# Patient Record
Sex: Female | Born: 1955 | Race: White | Hispanic: No | Marital: Married | Smoking: Former smoker
Health system: Southern US, Community
[De-identification: ages and names within clinical notes are randomized; demographics above are authoritative.]

## PROBLEM LIST (undated history)

## (undated) DIAGNOSIS — L409 Psoriasis, unspecified: Secondary | ICD-10-CM

## (undated) DIAGNOSIS — E119 Type 2 diabetes mellitus without complications: Secondary | ICD-10-CM

## (undated) HISTORY — PX: APPENDECTOMY: SHX54

---

## 1997-09-19 ENCOUNTER — Other Ambulatory Visit: Admission: RE | Admit: 1997-09-19 | Discharge: 1997-09-19 | Payer: Self-pay | Admitting: Obstetrics & Gynecology

## 2000-07-03 ENCOUNTER — Encounter: Payer: Self-pay | Admitting: Dermatology

## 2000-07-03 ENCOUNTER — Ambulatory Visit (HOSPITAL_COMMUNITY): Admission: RE | Admit: 2000-07-03 | Discharge: 2000-07-03 | Payer: Self-pay | Admitting: Dermatology

## 2000-07-03 ENCOUNTER — Encounter (INDEPENDENT_AMBULATORY_CARE_PROVIDER_SITE_OTHER): Payer: Self-pay | Admitting: Specialist

## 2000-09-03 ENCOUNTER — Other Ambulatory Visit: Admission: RE | Admit: 2000-09-03 | Discharge: 2000-09-03 | Payer: Self-pay | Admitting: Obstetrics & Gynecology

## 2000-09-03 ENCOUNTER — Encounter (INDEPENDENT_AMBULATORY_CARE_PROVIDER_SITE_OTHER): Payer: Self-pay

## 2001-09-16 ENCOUNTER — Other Ambulatory Visit: Admission: RE | Admit: 2001-09-16 | Discharge: 2001-09-16 | Payer: Self-pay | Admitting: Obstetrics & Gynecology

## 2002-11-02 ENCOUNTER — Other Ambulatory Visit: Admission: RE | Admit: 2002-11-02 | Discharge: 2002-11-02 | Payer: Self-pay | Admitting: Obstetrics & Gynecology

## 2003-09-28 ENCOUNTER — Encounter: Admission: RE | Admit: 2003-09-28 | Discharge: 2003-09-28 | Payer: Self-pay | Admitting: Family Medicine

## 2003-11-02 ENCOUNTER — Other Ambulatory Visit: Admission: RE | Admit: 2003-11-02 | Discharge: 2003-11-02 | Payer: Self-pay | Admitting: Obstetrics & Gynecology

## 2004-12-05 ENCOUNTER — Other Ambulatory Visit: Admission: RE | Admit: 2004-12-05 | Discharge: 2004-12-05 | Payer: Self-pay | Admitting: Obstetrics & Gynecology

## 2005-04-12 ENCOUNTER — Encounter (INDEPENDENT_AMBULATORY_CARE_PROVIDER_SITE_OTHER): Payer: Self-pay | Admitting: Specialist

## 2005-04-12 ENCOUNTER — Ambulatory Visit (HOSPITAL_COMMUNITY): Admission: RE | Admit: 2005-04-12 | Discharge: 2005-04-12 | Payer: Self-pay | Admitting: Dermatology

## 2006-10-24 ENCOUNTER — Encounter: Admission: RE | Admit: 2006-10-24 | Discharge: 2006-10-24 | Payer: Self-pay | Admitting: Family Medicine

## 2006-10-24 ENCOUNTER — Inpatient Hospital Stay (HOSPITAL_COMMUNITY): Admission: EM | Admit: 2006-10-24 | Discharge: 2006-10-27 | Payer: Self-pay | Admitting: Emergency Medicine

## 2006-10-25 ENCOUNTER — Encounter (INDEPENDENT_AMBULATORY_CARE_PROVIDER_SITE_OTHER): Payer: Self-pay | Admitting: Surgery

## 2006-11-15 ENCOUNTER — Encounter: Admission: RE | Admit: 2006-11-15 | Discharge: 2006-11-15 | Payer: Self-pay | Admitting: Family Medicine

## 2009-02-28 ENCOUNTER — Ambulatory Visit (HOSPITAL_COMMUNITY): Admission: RE | Admit: 2009-02-28 | Discharge: 2009-02-28 | Payer: Self-pay | Admitting: Dermatology

## 2010-04-05 IMAGING — US US BIOPSY
1 series · 14 of 15 positions shown · non-contrast
Comparison: none

CLINICAL DATA: Long-term methotrexate use

[Series 1: us biopsy · 0.26mm/px · 14 of 15 slices shown]
[im 1/15]
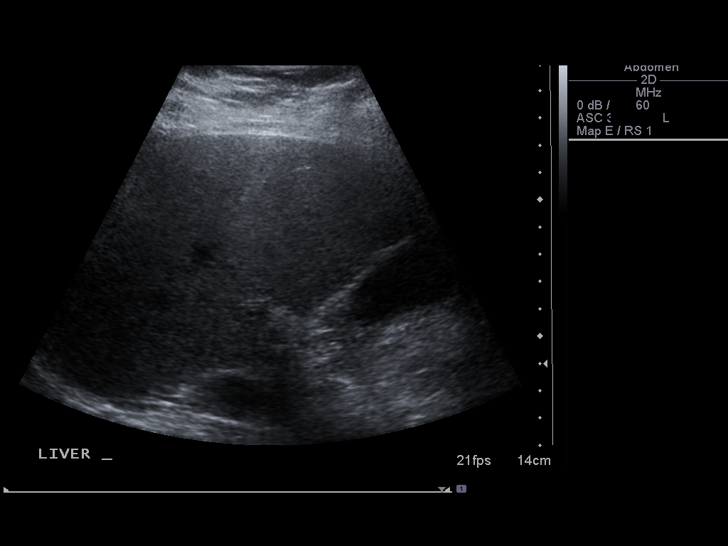
[im 2/15]
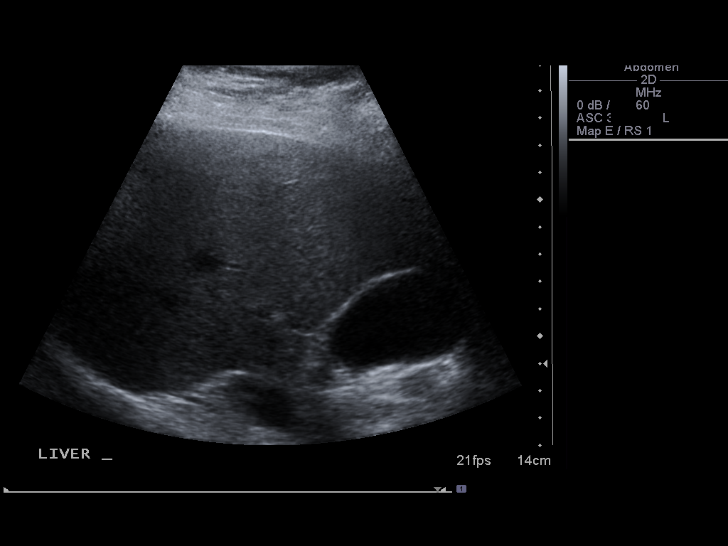
[im 3/15]
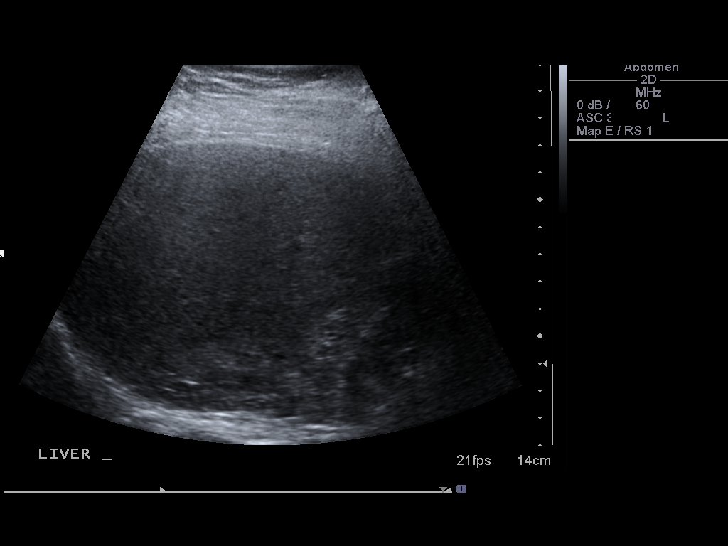
[im 4/15]
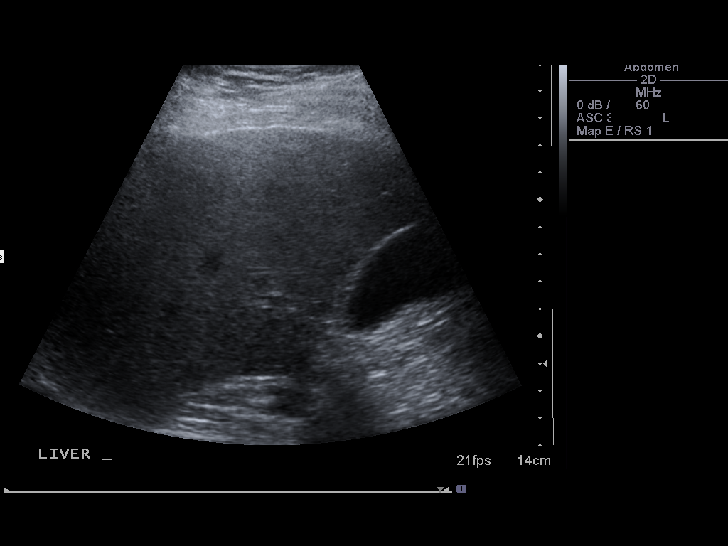
[im 5/15]
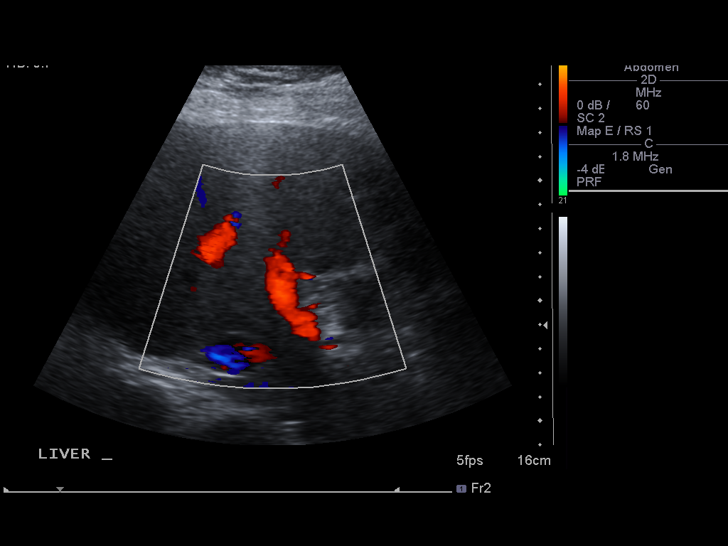
[im 6/15]
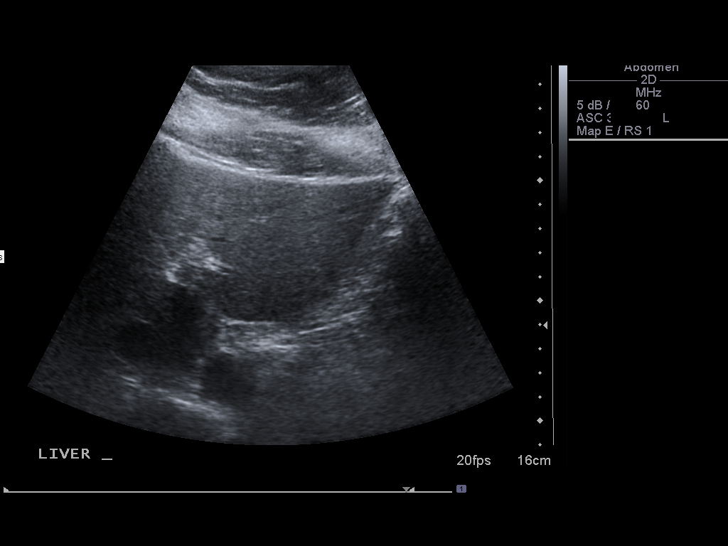
[im 7/15]
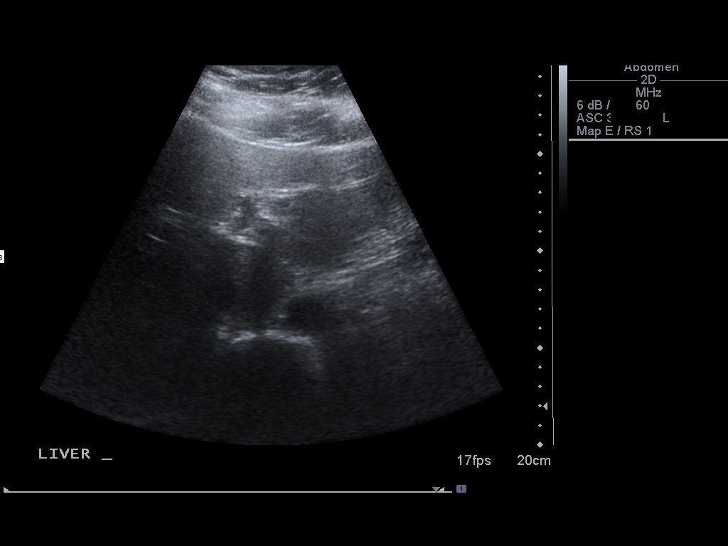
[im 9/15]
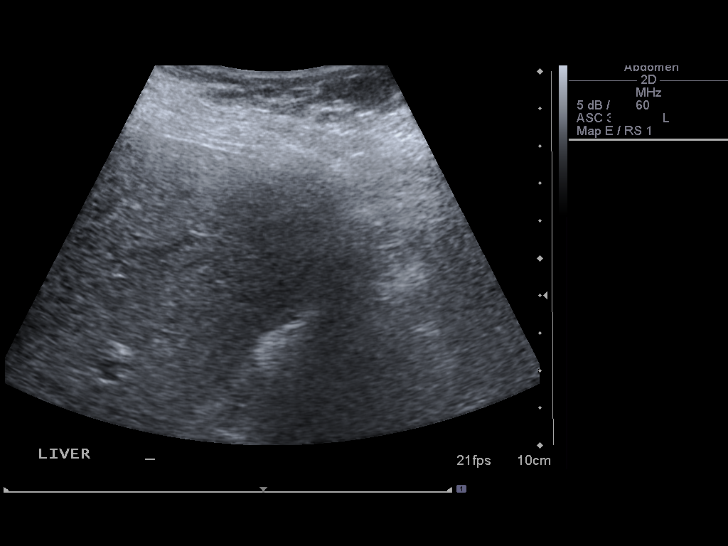
[im 10/15]
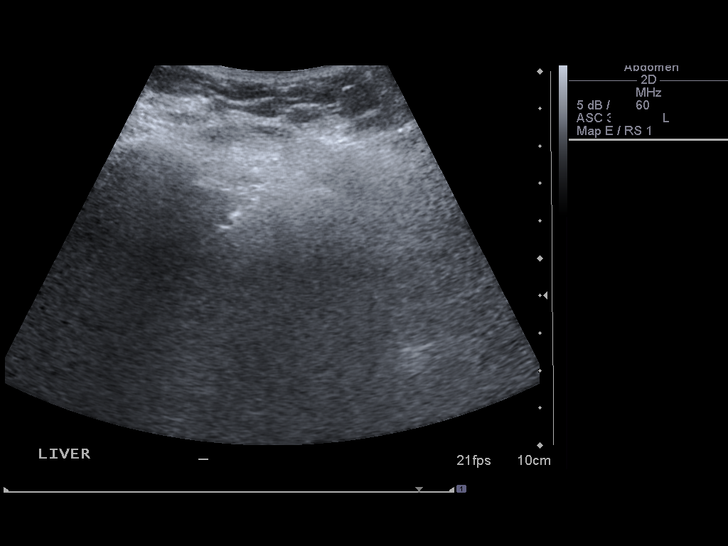
[im 11/15]
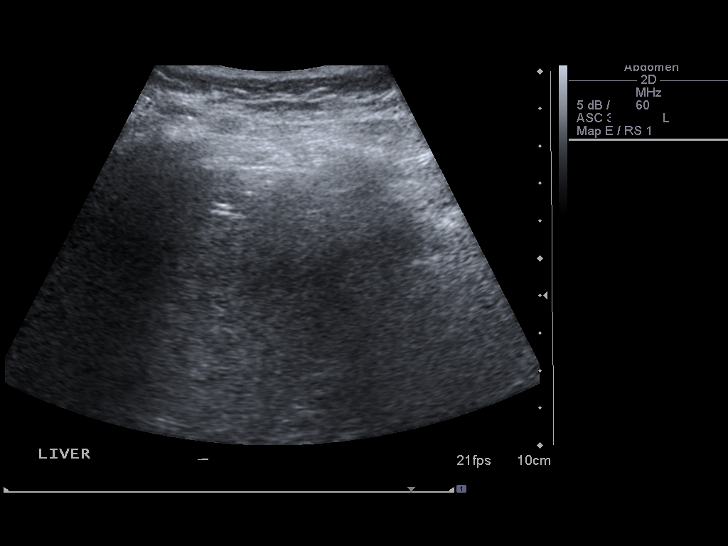
[im 12/15]
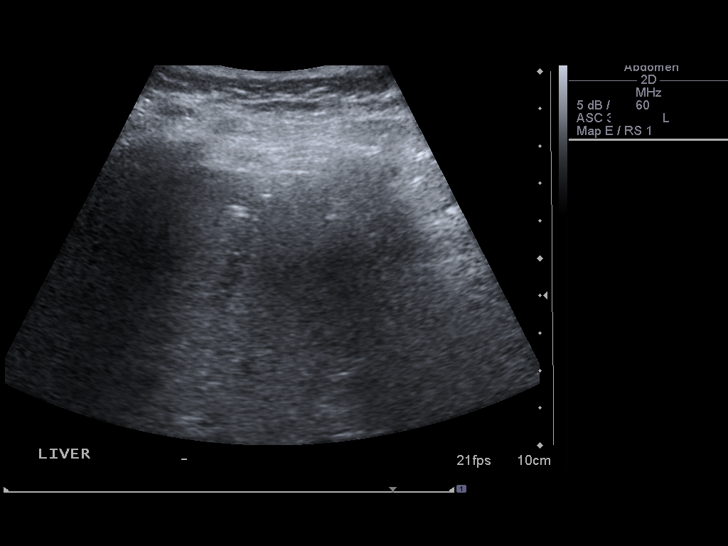
[im 13/15]
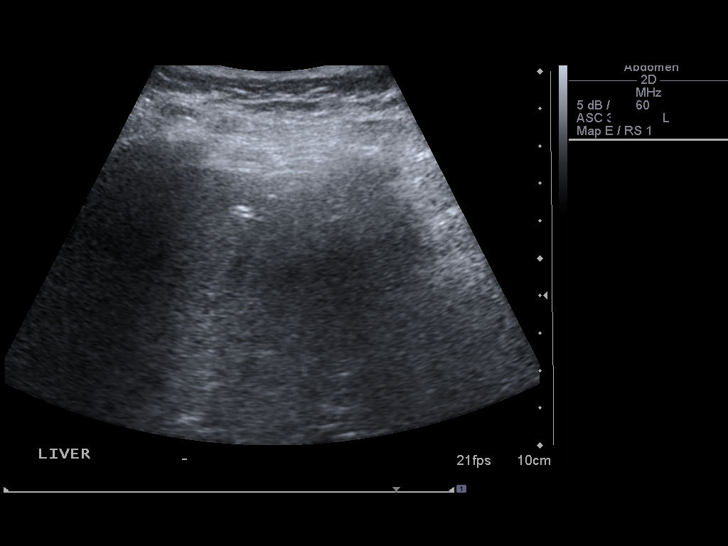
[im 14/15]
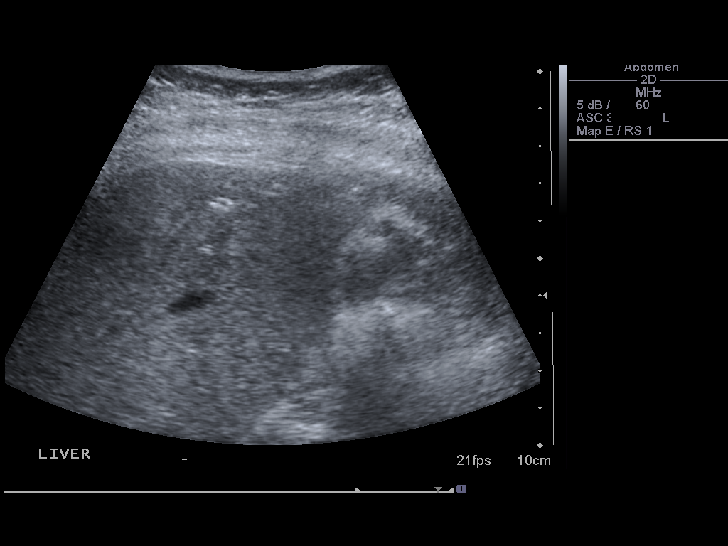
[im 15/15]
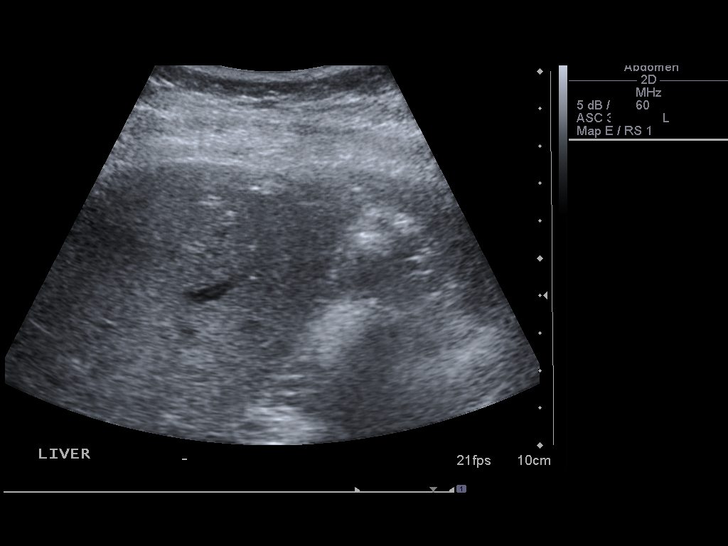

[14 of 15 positions shown; findings below may reference images not displayed]

ULTRASOUND-GUIDED CORE LIVER BIOPSY

Technique and findings: An ultrasound guided liver biopsy was
thoroughly discussed with the patient and questions were answered.
The benefits, risks, alternatives, and complications were also
discussed. The patient understands and wishes to proceed with the
procedure. A verbal as well as written consent was obtained.

Survey ultrasound of the liver was performed and an appropriate
skin entry site was determined. Skin site was marked, prepped with
Betadine, and draped in usual sterile fashion, and infiltrated
locally with 1% lidocaine. Intravenous fentanyl and Versed were
administered as conscious sedation during continuous
cardiorespiratory monitoring by the radiology RN, with a total
moderate sedation time of 7 minutes. A 17 gauge trocar needle was
advanced under ultrasound guidance into the liver.  3 coaxial 18
gauge core samples were then obtained through the guide needle. The
guide needle was removed. Postprocedure scans demonstrate no
apparent complication.

IMPRESSION

1. Technically successful ultrasound guided core liver biopsy.

## 2010-05-06 LAB — CBC
HCT: 37.6 % (ref 36.0–46.0)
Hemoglobin: 12.4 g/dL (ref 12.0–15.0)
MCHC: 32.9 g/dL (ref 30.0–36.0)
RBC: 4.07 MIL/uL (ref 3.87–5.11)
RDW: 15.7 % — ABNORMAL HIGH (ref 11.5–15.5)

## 2010-07-03 NOTE — H&P (Signed)
NAMECALVARY, DIFRANCO                 ACCOUNT NO.:  [id number]   MEDICAL RECORD NO.:  [id number]          PATIENT TYPE:  EMS   LOCATION:  MAJO                         FACILITY:  [facility]   PHYSICIAN:  Velora Heckler, MD      DATE OF BIRTH:  [birth year]   DATE OF ADMISSION:  10/24/2006  DATE OF DISCHARGE:                              HISTORY & PHYSICAL   REFERRING PHYSICIAN:  Joen Laura, MD, emergency department.   PRIMARY PHYSICIAN:  Blair Heys, MD, [facility] at [place].   CHIEF COMPLAINT:  Abdominal pain.   HISTORY OF PRESENT ILLNESS:  Kimmberly Hochstetler is a 55 year old white female  from [place], [place].  Presents with 24-hour history of right  lower quadrant abdominal pain.  The patient noted onset of pain.  She  denies nausea or vomiting.  She denies fevers or chills.  She denies  diarrhea or constipation.  She was seen at the office of her primary  care physician and referred to [facility] imaging for CT scan of the  abdomen and pelvis which was performed this afternoon.  That  demonstrated findings consistent with retrocecal acute appendicitis.  Other significant findings included a left renal mass and a left adrenal  adenoma.  These will require further workup at a later time.   PAST MEDICAL HISTORY:  History of psoriasis, history of heart murmur  secondary to rheumatic fever as a child, status post excision of  ganglion cyst from wrist.   MEDICATIONS:  Methotrexate, Prempro, multivitamin.   ALLERGIES:  None known.   SOCIAL HISTORY:  The patient is married.  She has 2 children.  She is  accompanied by family members.  She works in Location manager at [employer].  She quit smoking 3 years ago.  She drinks alcohol on occasion.   FAMILY HISTORY:  Noncontributory.   REVIEW OF SYSTEMS:  Fifteen-system review without significant other  findings except as noted above.   EXAM:  GENERAL:  55 year old mildly obese white female, no acute  distress, on a stretcher in the emergency  department.  Temp 98.3, pulse 83, respirations 20, blood pressure 126/86.  HEENT:  Shows her to be normocephalic, atraumatic.  Sclerae clear.  Conjunctiva clear.  Pupils equal and reactive.  Dentition good.  Mucous  membranes moist.  Voice normal.  Palpation of the neck shows no thyroid  nodularity.  No lymphadenopathy.  No tenderness.  No mass.  Airway is  midline.  LUNGS: Clear to auscultation bilaterally without rales, rhonchi or  wheeze.  CARDIAC EXAM:  Regular rate and rhythm without murmur.  Peripheral  pulses are full.  EXTREMITIES:  Nontender without edema.  ABDOMEN:  Soft.  Bowel sounds are present.  No surgical wounds.  Palpation, however, reveals tenderness in the right lower quadrant,  extending into the right flank.  There is mild guarding.  There is no  rebound.  There is no palpable mass.  There is no sign of hernia.  There  is no hepatosplenomegaly.  NEUROLOGICALLY:  The patient is alert and oriented without focal  deficit.  There is no sign of tremor.  LABORATORY STUDIES:  White count 10.1, hemoglobin 12.5, platelet count  334,000.  Electrolytes are normal.   RADIOGRAPHIC STUDIES:  CT scan abdomen and pelvis reviewed with Dr.  Maryelizabeth Rowan shows findings consistent with retrocecal acute  appendicitis.   IMPRESSION:  Acute appendicitis, probably retrocecal.   PLAN:  The patient will be admitted on the general surgery service at  [facility].  She will require Unasyn and gentamicin for  valvular prophylaxis and preoperative coverage.  She will be prepared  and taken to the operating room for appendectomy.  The patient and her  family, and I discussed laparoscopic approach versus the potential need  for open surgery.  They understand.  The patient and her family, and I  also discussed the other findings on CT scan including the left adrenal  mass and left renal mass, both of which will require further workup by  Dr. Gaylan Gerold following her acute illness.   They understand and wish to  proceed.      Velora Heckler, MD  Electronically Signed     TMG/MEDQ  D:  10/24/2006  T:  10/25/2006  Job:  [id number]   cc:   Velora Heckler, MD  Suzzette Righter, MD

## 2010-07-03 NOTE — Op Note (Signed)
NAMETraniece, [name]                 ACCOUNT NO.:  [id number]   MEDICAL RECORD NO.:  [id number]          PATIENT TYPE:  OBV   LOCATION:  5727                         FACILITY:  [facility]   PHYSICIAN:  Velora Heckler, MD      DATE OF BIRTH:  10/23/1955   DATE OF PROCEDURE:  10/25/2006  DATE OF DISCHARGE:                               OPERATIVE REPORT   PREOPERATIVE DIAGNOSIS:  Acute appendicitis.   POSTOPERATIVE DIAGNOSIS:  Acute appendicitis.   PROCEDURE:  Laparoscopic appendectomy.   SURGEON:  Velora Heckler, M.D., FACS   ANESTHESIA:  General.   ESTIMATED BLOOD LOSS:  Minimal.   PREPARATION:  Betadine.   COMPLICATIONS:  None.   INDICATIONS:  The patient is a 55 year old white female who presents  with a 24 hour history of right lower quadrant abdominal pain.  The  patient has had no fevers or chills.  She denies nausea or vomiting.  She has had a normal bowel movement.  The patient was seen by her  primary care physician and underwent CT scan of the abdomen which showed  findings consistent with acute appendicitis.  The patient is now brought  to the operating room for appendectomy.   BODY OF REPORT:  The procedure was done in OR number 16 at [facility].  The patient was brought to the operating room and placed in a  supine position on the operating room table.  Following the  administration of general anesthesia, the patient is prepped and draped  in the usual strict aseptic fashion.  After ascertaining that an  adequate level of anesthesia had been obtained, a midline incision was  made just above the level of the umbilicus with a #15 blade.  Dissection  was carried down to the fascia.  The fascia was incised in the midline  and the peritoneal cavity is entered cautiously.  A 0 Vicryl pursestring  suture was placed in the fascia.  An Hassan cannula was introduced and  secured with a pursestring suture.  The abdomen was insufflated with  carbon dioxide.  The  laparoscope was introduced and the abdomen  explored.  Operative ports were placed in the right upper quadrant and  left lower quadrant.   The cecum was mobilized.  The terminal ileum was somewhat adherent to  the right pelvic brim.  Adhesions were taken down with the harmonic  scalpel, allowing for mobilization of the terminal ileum and base of the  cecum.  There is a large inflammatory mass involving the base of the  cecum and extending laterally up the right colic gutter.  This was  gently dissected out using the harmonic scalpel and gentle blunt  dissection.  The tip of the appendix was adherent to the abdominal  sidewall likely explaining the patient's right lower quadrant pain.  After a difficult dissection, the base of the appendix was identified in  the inflammatory mass.  It is transected with an Endo-GIA stapler.  The  appendiceal artery is secured with a Liga clip.  The appendiceal  mesentery is taken down with the harmonic  scalpel.  Dissection was  carried the length of the appendix, skeletonizing the proximal appendix.  The distal appendix was wrapped in an inflammatory mass which was  excised with the specimen.  Good hemostasis was noted.  Fluid was  irrigated.  No true abscess exist.   There is considerable drainage from the right colic gutter which appears  inflammatory.  Therefore, a 19-French Harrison Mons drain is brought in through  a stab wound in the right lower quadrant and placed across the area of  inflammation and base of the cecum.  The drain is secured to the skin  with a 3-0 nylon suture and placed to bulb suction.  The appendix was  placed into an EndoCatch bag and withdrawn through the left lower  quadrant port.  The right lower quadrant was irrigated with warm saline  which was evacuated.  Good hemostasis was noted.  No complications were  noted.  The appendix was withdrawn through the left lower quadrant port.  Pneumoperitoneum was released.  All ports were  removed.  Good hemostasis  was noted at all port sites.  The 0 Vicryl pursestring suture was tied  securely.  The wounds were irrigated with warm saline.  The wounds were  anesthetized with local Marcaine.  All skin incisions were closed with  interrupted 4-0 Vicryl subcuticular sutures.  The wounds were washed and  dried and Benzoin and Steri-Strips were applied.  Sterile dressings were  applied.  The patient is awakened from anesthesia and brought to the  recovery room in stable condition.  The patient tolerated the procedure  well.      Velora Heckler, MD  Electronically Signed     TMG/MEDQ  D:  10/25/2006  T:  10/25/2006  Job:  [id number]   cc:   Bryan Lemma. Manus Gunning, M.D.

## 2010-07-06 NOTE — Discharge Summary (Signed)
NAME:  [name], [name]                 ACCOUNT NO.:  [id number]   MEDICAL RECORD NO.:  [id number]          PATIENT TYPE:  INP   LOCATION:  5727                         FACILITY:  [facility]   PHYSICIAN:  Adolph Pollack, M.D.DATE OF BIRTH:  [birth year]   DATE OF ADMISSION:  10/24/2006  DATE OF DISCHARGE:  10/27/2006                               DISCHARGE SUMMARY   CHIEF COMPLAINT/REASON FOR ADMISSION:  Ms. Shoaf is a 50-year female  patient who presented to the ER for lower quadrant abdominal pain.  CT  demonstrated acute retrocecal appendicitis.  On abdominal exam, she was  tender in right lower quadrant extending into the right flank.  White  count was 10,100.  The patient was subsequently admitted with a  diagnosis of acute appendicitis.  In addition, the patient has a history  of a heart murmur secondary to rheumatic fever as a child, so she was  given Unasyn and gentamicin for valvular prophylaxis as well as  preoperative coverage before undergoing surgery.   HOSPITAL COURSE:  The patient was admitted and taken to the OR where she  underwent laparoscopic appendectomy without incident for acute  appendicitis which was nonperforated.  In the intraoperative period, a  JP drain was placed which drained thin serous and bloody returns  throughout the hospitalization.   By postoperative day 2, the patient was tolerating a solid diet and  Percocet.  Abdominal incisions were unremarkable.  Her JP drain still  had 110 cc of bloody serous fluid.  She was otherwise deemed appropriate  for discharge home.  Dr. Abbey Chatters recommended she be discharged with  the drain in place and follow up at the office next week to have the  drain removed.   FINAL DISCHARGE DIAGNOSES:  1. Acute appendicitis, nonperforated.  2. Status post laparoscopic appendectomy.  3. Known heart murmur.  The patient did receive prophylactic      antibiotics preoperatively.   DISCHARGE MEDICATIONS:  1. Methotrexate 2.5  mg 6 tablets weekly.  2. Prempro 0.65 daily.  3. Multivitamin daily.  4. Additional medications:  Percocet 5/325 1-2 tablets every 4 hours      as needed for pain.   HOME CARE INSTRUCTIONS:  1. Please refer to the [facility] instructions for laparoscopic      procedures, noting that the patient has been instructed to call the      doctor if she has any problems with the wounds, fever greater than      101 or increased pain.  2. She has a JP drain left in place.  She is to keep the bulb      compressed and empty this when full.  Write down any amounts      emptied and bring this to the visit with Dr. Gerrit Friends.   FOLLOW UP:  She needs to follow up with Dr. Gerrit Friends the following  Thursday.  She needs to call to arrange an appointment.  This is for  evaluation of her drain and possible discontinuation of JP drain.      Allison L. Rennis Harding, N.P.  Adolph Pollack, M.D.  Electronically Signed    ALE/MEDQ  D:  01/23/2007  T:  01/24/2007  Job:  [id number]   cc:   Velora Heckler, MD  Bryan Lemma. Manus Gunning, M.D.

## 2010-11-30 LAB — CBC
Hemoglobin: 11.6 — ABNORMAL LOW
MCHC: 33.7
MCHC: 34
MCV: 90.1
Platelets: 333
Platelets: 334
RBC: 4.08
RDW: 15.9 — ABNORMAL HIGH
WBC: 10.1

## 2010-11-30 LAB — I-STAT 8, (EC8 V) (CONVERTED LAB)
Acid-Base Excess: 2
BUN: 9
Bicarbonate: 26.8 — ABNORMAL HIGH
Chloride: 103
HCT: 40
Hemoglobin: 13.6
Operator id: 192351
Sodium: 136
pCO2, Ven: 41.2 — ABNORMAL LOW

## 2010-11-30 LAB — APTT: aPTT: 28

## 2010-11-30 LAB — DIFFERENTIAL
Basophils Relative: 1
Eosinophils Absolute: 0
Monocytes Relative: 7
Neutro Abs: 6.8
Neutrophils Relative %: 68

## 2010-11-30 LAB — PROTIME-INR
INR: 1
Prothrombin Time: 13.8

## 2010-11-30 LAB — POCT I-STAT CREATININE: Creatinine, Ser: 0.7

## 2011-02-04 ENCOUNTER — Other Ambulatory Visit: Payer: Self-pay | Admitting: Obstetrics & Gynecology

## 2011-11-14 ENCOUNTER — Other Ambulatory Visit: Payer: Self-pay | Admitting: Orthopedic Surgery

## 2011-11-21 NOTE — H&P (Signed)
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  She has had a one year history of increasing pain to the left first dorsal compartment.  She has been wearing a forearm based thumb spica splint without relief.  She has had psoriasis since age 53, has been followed by Dorinda Hill and his partners. She is on methotrexate and folic acid supplements.  She was diagnosed to have diabetes recently with a hemoglobin A1C of 7.  She is working on her diet.   She is now referred for an upper extremity orthopaedic consult.  Her past medical history is reviewed in detail. She reports that she is 5'10" tall and does not report her weight.  She is moderately overweight.  She has been using Tylenol as her primary analgesic. She reports no drug allergies.  Current medications are methotrexate #7 2.5 mg. tablets weekly, folic acid 400 mcg. daily, omeprazole 20 mg. daily and over-the-counter Estroven.  Prior surgery, appendectomy in 2008.  Social history reveals that she is married, she is a nonsmoker and does not drink alcoholic beverages.   Family history is detailed and positive for diabetes, coronary artery disease and arthritis affecting primary relatives.   14-point review of systems reveals corrective lenses and tinnitus.  Physical examination reveals a well appearing 56 year-old woman.  Inspection of her hands and wrist reveals swelling over her left first dorsal compartment. She has tenderness on palpation of the compartments specifically over the extensor carpi radialis brevis. She has pain with Lourena Simmonds maneuver. She also has some pain with radiocarpal translation.  Pulse and cap refill are intact.  Motor and sensory examinations are intact.  X-rays of her left wrist demonstrate some narrowing of her scaphoid radial styloid distance on the PA view. There is no sign of calcification.   ASSESSMENT:   Stenosing tenosynovitis left first dorsal compartment with background type  II diabetes.   PLAN:To the OR for release left first DC.The procedure, risks,benefits and post-op course were discussed with the patient at length and they were in agreement with the plan.

## 2011-11-22 ENCOUNTER — Ambulatory Visit (HOSPITAL_BASED_OUTPATIENT_CLINIC_OR_DEPARTMENT_OTHER)
Admission: RE | Admit: 2011-11-22 | Discharge: 2011-11-22 | Disposition: A | Discharge status: Home / Self Care | Payer: BC Managed Care – PPO | Source: Ambulatory Visit | Attending: Orthopedic Surgery | Admitting: Orthopedic Surgery

## 2011-11-22 ENCOUNTER — Encounter (HOSPITAL_BASED_OUTPATIENT_CLINIC_OR_DEPARTMENT_OTHER)
Admission: RE | Disposition: A | Discharge status: Home / Self Care | Payer: Self-pay | Source: Ambulatory Visit | Attending: Orthopedic Surgery

## 2011-11-22 DIAGNOSIS — M654 Radial styloid tenosynovitis [de Quervain]: Secondary | ICD-10-CM | POA: Insufficient documentation

## 2011-11-22 DIAGNOSIS — E119 Type 2 diabetes mellitus without complications: Secondary | ICD-10-CM | POA: Insufficient documentation

## 2011-11-22 SURGERY — MINOR RELEASE DORSAL COMPARTMENT (DEQUERVAINS)
Anesthesia: LOCAL | Site: Arm Lower | Laterality: Left | Wound class: Clean

## 2011-11-22 MED ORDER — CHLORHEXIDINE GLUCONATE 4 % EX LIQD: 60.0000 mL | Freq: Once | CUTANEOUS | Status: DC

## 2011-11-22 MED ORDER — LIDOCAINE HCL 2 % IJ SOLN
INTRAMUSCULAR | Status: DC | PRN
  Administered 2011-11-22: 2.5 mL

## 2011-11-22 MED ORDER — HYDROCODONE-ACETAMINOPHEN 5-325 MG PO TABS: ORAL_TABLET | ORAL | Status: DC

## 2011-11-22 SURGICAL SUPPLY — 35 items
BANDAGE ELASTIC 3 VELCRO ST LF (GAUZE/BANDAGES/DRESSINGS) ×2 IMPLANT
BLADE MINI RND TIP GREEN BEAV (BLADE) IMPLANT
BLADE SURG 15 STRL LF DISP TIS (BLADE) ×1 IMPLANT
BLADE SURG 15 STRL SS (BLADE) ×2
BRUSH SCRUB EZ PLAIN DRY (MISCELLANEOUS) ×2 IMPLANT
CLOTH BEACON ORANGE TIMEOUT ST (SAFETY) ×2 IMPLANT
CORDS BIPOLAR (ELECTRODE) ×1 IMPLANT
COVER MAYO STAND STRL (DRAPES) ×2 IMPLANT
COVER TABLE BACK 60X90 (DRAPES) ×2 IMPLANT
CUFF TOURNIQUET SINGLE 18IN (TOURNIQUET CUFF) ×1 IMPLANT
DECANTER SPIKE VIAL GLASS SM (MISCELLANEOUS) IMPLANT
DRAPE EXTREMITY T 121X128X90 (DRAPE) ×2 IMPLANT
DRAPE SURG 17X23 STRL (DRAPES) ×2 IMPLANT
DRSG TEGADERM 4X4.75 (GAUZE/BANDAGES/DRESSINGS) ×1 IMPLANT
GLOVE BIO SURGEON STRL SZ 6.5 (GLOVE) ×2 IMPLANT
GLOVE BIOGEL M STRL SZ7.5 (GLOVE) ×2 IMPLANT
GLOVE ORTHO TXT STRL SZ7.5 (GLOVE) ×2 IMPLANT
GOWN PREVENTION PLUS XLARGE (GOWN DISPOSABLE) ×2 IMPLANT
GOWN PREVENTION PLUS XXLARGE (GOWN DISPOSABLE) ×4 IMPLANT
NEEDLE 27GAX1X1/2 (NEEDLE) ×2 IMPLANT
PACK BASIN DAY SURGERY FS (CUSTOM PROCEDURE TRAY) ×2 IMPLANT
PAD CAST 3X4 CTTN HI CHSV (CAST SUPPLIES) IMPLANT
PADDING CAST ABS 4INX4YD NS (CAST SUPPLIES) ×1
PADDING CAST ABS COTTON 4X4 ST (CAST SUPPLIES) ×1 IMPLANT
PADDING CAST COTTON 3X4 STRL (CAST SUPPLIES)
SPONGE GAUZE 4X4 12PLY (GAUZE/BANDAGES/DRESSINGS) ×2 IMPLANT
STOCKINETTE 4X48 STRL (DRAPES) ×2 IMPLANT
STRIP CLOSURE SKIN 1/2X4 (GAUZE/BANDAGES/DRESSINGS) ×2 IMPLANT
SUT PROLENE 3 0 PS 2 (SUTURE) ×2 IMPLANT
SUT VIC AB 4-0 P-3 18XBRD (SUTURE) IMPLANT
SUT VIC AB 4-0 P3 18 (SUTURE)
SYR 3ML 23GX1 SAFETY (SYRINGE) IMPLANT
SYR CONTROL 10ML LL (SYRINGE) ×2 IMPLANT
TRAY DSU PREP LF (CUSTOM PROCEDURE TRAY) ×2 IMPLANT
UNDERPAD 30X30 INCONTINENT (UNDERPADS AND DIAPERS) ×2 IMPLANT

## 2011-11-22 NOTE — Brief Op Note (Signed)
11/22/2011  10:33 AM  PATIENT:  Lailee R Shonka  56 y.o. female  PRE-OPERATIVE DIAGNOSIS:  left first dorsal sts   POST-OPERATIVE DIAGNOSIS: left first dorsal sts  PROCEDURE:  Procedure(s) (LRB) with comments: MINOR RELEASE DORSAL COMPARTMENT (DEQUERVAINS) (Left) - release left dorsal compartment   SURGEON:  Surgeon(s) and Role:    * Wyn Forster., MD - Primary  PHYSICIAN ASSISTANT:   ASSISTANTS:Leonore Frankson Dasnoit,P.A-C   ANESTHESIA:   local  EBL:     BLOOD ADMINISTERED:none  DRAINS: none   LOCAL MEDICATIONS USED:  XYLOCAINE   SPECIMEN:  No Specimen  DISPOSITION OF SPECIMEN:  N/A  COUNTS:  YES  TOURNIQUET:   Total Tourniquet Time Documented: Upper Arm (Left) - 7 minutes  DICTATION: .Other Dictation: Dictation Number [id number]  PLAN OF CARE: Discharge to home after PACU  PATIENT DISPOSITION:  PACU - hemodynamically stable.

## 2011-11-22 NOTE — Op Note (Signed)
[id number] 

## 2011-11-25 ENCOUNTER — Encounter (HOSPITAL_BASED_OUTPATIENT_CLINIC_OR_DEPARTMENT_OTHER): Payer: Self-pay

## 2011-11-25 NOTE — Op Note (Signed)
NAMEKamrie, [name]                 ACCOUNT NO.:  [id number]  MEDICAL RECORD NO.:  [id number]  LOCATION:                                 FACILITY:  PHYSICIAN:  Elaine Fitch. Macen Joslin, M.D. 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  OPERATION:  Release of left first dorsal compartment with resection of septum.  OPERATING SURGEON:  Elaine Fitch. Sanora Cunanan, MD  ASSISTANT:  Marveen Reeks Dasnoit, PA-C  ANESTHESIA:  2% lidocaine.  SUPERVISING PHYSICIAN:  Elaine Fitch. Albertha Beattie, MD.  INDICATIONS:  [name] is a 56 year old woman referred by Dr. Manus Gunning for evaluation and management of painful left wrist.  She had signs of de Quervain stenosing tenosynovitis.  She has not responded splinting activity modification and injection.  Due to a failure to respond to nonoperative measures, she is brought to the operating room at this time for release of her left first dorsal compartment.  PROCEDURE:  [name] was interviewed in the holding area and had a second informed consent.  Her proper surgical site was identified.  She was transferred to room #1 of the [facility] where after informed consent and alcohol Betadine prep, 2% plain lidocaine was infiltrated into the path of the intended incision and the first dorsal compartment.  After few moments, excellent anesthesia was achieved.  The left hand and arm were prepped with Betadine soap and solution, and sterilely draped.  A pneumatic tourniquet was applied to the proximal left brachium.  Following exsanguination of the left arm with an Esmarch bandage, the arterial tourniquet was inflated to 220 mmHg.  Following routine surgical time-out, procedure commenced with a short  transverse incision directly over the palpably thickened first dorsal compartment. Subcutaneous tissues were carefully divided identifying the compartment and gently retracting the radial superficial sensory branches.  Ragnell retractors were placed followed by splitting the compartment with scalpel.  We identified a 3-mm wide septum.  This was released, dorsal and palmar, and the septum removed with a rongeur.  Thereafter, free range of motion of the abductor pollicis longus and extensor pollicis brevis tendons was noted.  The wound was then inspected for bleeding points, repaired with intradermal 3-0 Prolene and Steri-Strip.  [name] was placed in Ace bandage.  She will be encouraged to begin immediate range of motion and exercises.  There were no apparent complications.  For aftercare, she is advised to use her hand as tolerated.  She is provided Vicodin 5 mg 1 p.o. q. 4-6 hours p.r.n. pain, 16 tablets without refill.  She may return to work when she feels able.  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     [name], M.D.     RVS/MEDQ  D:  11/22/2011  T:  11/23/2011  Job:  [id number]

## 2013-04-26 ENCOUNTER — Other Ambulatory Visit: Payer: Self-pay | Admitting: Obstetrics & Gynecology

## 2013-10-17 ENCOUNTER — Encounter (HOSPITAL_COMMUNITY): Payer: Self-pay | Admitting: Emergency Medicine

## 2013-10-17 ENCOUNTER — Emergency Department (HOSPITAL_COMMUNITY)
Admission: EM | Admit: 2013-10-17 | Discharge: 2013-10-17 | Disposition: A | Discharge status: Home / Self Care | Payer: 59 | Attending: Emergency Medicine | Admitting: Emergency Medicine

## 2013-10-17 ENCOUNTER — Emergency Department (HOSPITAL_COMMUNITY): Payer: 59

## 2013-10-17 DIAGNOSIS — Z23 Encounter for immunization: Secondary | ICD-10-CM | POA: Insufficient documentation

## 2013-10-17 DIAGNOSIS — Y9389 Activity, other specified: Secondary | ICD-10-CM | POA: Insufficient documentation

## 2013-10-17 DIAGNOSIS — Z87891 Personal history of nicotine dependence: Secondary | ICD-10-CM | POA: Diagnosis not present

## 2013-10-17 DIAGNOSIS — S8990XA Unspecified injury of unspecified lower leg, initial encounter: Secondary | ICD-10-CM | POA: Insufficient documentation

## 2013-10-17 DIAGNOSIS — Y92009 Unspecified place in unspecified non-institutional (private) residence as the place of occurrence of the external cause: Secondary | ICD-10-CM | POA: Diagnosis not present

## 2013-10-17 DIAGNOSIS — S91309A Unspecified open wound, unspecified foot, initial encounter: Secondary | ICD-10-CM | POA: Diagnosis not present

## 2013-10-17 DIAGNOSIS — Z79899 Other long term (current) drug therapy: Secondary | ICD-10-CM | POA: Diagnosis not present

## 2013-10-17 DIAGNOSIS — S99919A Unspecified injury of unspecified ankle, initial encounter: Secondary | ICD-10-CM | POA: Diagnosis present

## 2013-10-17 DIAGNOSIS — E119 Type 2 diabetes mellitus without complications: Secondary | ICD-10-CM | POA: Insufficient documentation

## 2013-10-17 DIAGNOSIS — Z872 Personal history of diseases of the skin and subcutaneous tissue: Secondary | ICD-10-CM | POA: Diagnosis not present

## 2013-10-17 DIAGNOSIS — W208XXA Other cause of strike by thrown, projected or falling object, initial encounter: Secondary | ICD-10-CM | POA: Insufficient documentation

## 2013-10-17 DIAGNOSIS — S91311A Laceration without foreign body, right foot, initial encounter: Secondary | ICD-10-CM

## 2013-10-17 DIAGNOSIS — S99929A Unspecified injury of unspecified foot, initial encounter: Secondary | ICD-10-CM

## 2013-10-17 HISTORY — DX: Psoriasis, unspecified: L40.9

## 2013-10-17 HISTORY — DX: Type 2 diabetes mellitus without complications: E11.9

## 2013-10-17 MED ORDER — HYDROCODONE-ACETAMINOPHEN 5-325 MG PO TABS: ORAL_TABLET | ORAL | Status: AC

## 2013-10-17 MED ORDER — TETANUS-DIPHTH-ACELL PERTUSSIS 5-2.5-18.5 LF-MCG/0.5 IM SUSP
0.5000 mL | Freq: Once | INTRAMUSCULAR | Status: AC
  Administered 2013-10-17: 0.5 mL via INTRAMUSCULAR
  Filled 2013-10-17: qty 0.5

## 2013-10-17 MED ORDER — HYDROCODONE-ACETAMINOPHEN 5-325 MG PO TABS
2.0000 | ORAL_TABLET | Freq: Once | ORAL | Status: AC
  Administered 2013-10-17: 2 via ORAL
  Filled 2013-10-17: qty 2

## 2013-10-17 NOTE — ED Provider Notes (Signed)
CSN: [id number]     Arrival date & time 10/17/13  2052 History   First MD Initiated Contact with Patient 10/17/13 2132     Chief Complaint  Patient presents with  . Foot Injury    (Consider location/radiation/quality/duration/timing/severity/associated sxs/prior Treatment) HPI Comments: 58 year old female presents to the emergency department for right foot pain. She states that she was working in her garage when her head showed tremor bowel off the wall and onto her right foot. Patient noted to have 2 lacerations to the dorsal aspect of her right foot distally. Bleeding controlled. Patient denies the use of blood thinners. She endorses an aching, nonradiating pain sensation which has been constant. Pain is worse with palpation and ambulation. No medications taken prior to arrival.  Patient is a 58 y.o. female presenting with foot injury. The history is provided by the patient. No language interpreter was used.  Foot Injury Location:  Foot Time since incident:  2 hours Injury: yes   Foot location:  R foot Pain details:    Quality:  Aching   Radiates to:  Does not radiate   Severity:  Mild   Onset quality:  Sudden   Duration:  2 hours   Timing:  Constant   Progression:  Unchanged Chronicity:  New Dislocation: no   Tetanus status:  Unknown Prior injury to area:  No Relieved by:  None tried Worsened by:  Bearing weight Ineffective treatments:  None tried Associated symptoms: no decreased ROM, no fever, no muscle weakness, no numbness, no swelling and no tingling     Past Medical History  Diagnosis Date  . Diabetes mellitus without complication   . Psoriasis    Past Surgical History  Procedure Laterality Date  . Appendectomy     No family history on file. History  Substance Use Topics  . Smoking status: Former Research scientist (life sciences)  . Smokeless tobacco: Not on file  . Alcohol Use: No   OB History   Grav Para Term Preterm Abortions TAB SAB Ect Mult Living                  Review  of Systems  Constitutional: Negative for fever.  Musculoskeletal: Positive for myalgias. Negative for arthralgias and joint swelling.  Skin: Positive for wound.  Neurological: Negative for weakness and numbness.  All other systems reviewed and are negative.    Allergies  Review of patient's allergies indicates no known allergies.  Home Medications   Prior to Admission medications   Medication Sig Start Date End Date Taking? Authorizing Provider  HYDROcodone-acetaminophen (NORCO/VICODIN) 5-325 MG per tablet 1 or 2 tabs every 6 hours as needed for pain 10/17/13   Antonietta Breach, PA-C  methotrexate (RHEUMATREX) 2.5 MG tablet Take 2.5 mg by mouth once a week. Caution:Chemotherapy. Protect from light.    Historical Provider, MD  omeprazole (PRILOSEC) 20 MG capsule Take 20 mg by mouth daily.    Historical Provider, MD   BP 130/75  Pulse 72  Temp(Src) 97.7 F (36.5 C) (Oral)  Resp 18  Ht 5\' 10"  (1.778 m)  Wt 230 lb (104.327 kg)  BMI 33.00 kg/m2  SpO2 100%  Physical Exam  Nursing note and vitals reviewed. Constitutional: She is oriented to person, place, and time. She appears well-developed and well-nourished. No distress.  Nontoxic/nonseptic appearing.  HENT:  Head: Normocephalic and atraumatic.  Eyes: Conjunctivae and EOM are normal. No scleral icterus.  Neck: Normal range of motion.  Cardiovascular: Normal rate, regular rhythm and intact distal pulses.  DP and PT pulses 2+ in RLE  Pulmonary/Chest: Effort normal. No respiratory distress.  Musculoskeletal: Normal range of motion. She exhibits tenderness.       Right ankle: Normal.       Right foot: She exhibits tenderness and laceration. She exhibits normal range of motion, no bony tenderness, no swelling, normal capillary refill, no crepitus and no deformity.       Feet:  Neurological: She is alert and oriented to person, place, and time. She exhibits normal muscle tone. Coordination normal.  No gross sensory deficits  appreciated. Patient ambulates with antalgic gait, unassisted.  Skin: Skin is warm and dry. No rash noted. She is not diaphoretic. No erythema. No pallor.  2 lacerations to the dorsal, distal aspect of the R foot. One laceration is "L" shaped and approximately 1.5cm. Second is linear and approximately 1cm in length. Bleeding controlled.  Psychiatric: She has a normal mood and affect. Her behavior is normal.    ED Course  Procedures (including critical care time) Labs Review Labs Reviewed - No data to display  Imaging Review Dg Foot Complete Right  10/17/2013   CLINICAL DATA:  FOOT INJURY  EXAM: RIGHT FOOT COMPLETE - 3+ VIEW  COMPARISON:  None.  FINDINGS: There is no evidence of fracture or dislocation. Small calcaneal spur at the plantar aponeurosis. No radiodense foreign body. No subcutaneous gas. There is no evidence of arthropathy or other focal bone abnormality. Soft tissues are unremarkable.  IMPRESSION: Negative.   Electronically Signed   By: Arne Cleveland M.D.   On: 10/17/2013 22:05     EKG Interpretation None     LACERATION REPAIR Performed by: Antonietta Breach Authorized by: Antonietta Breach Consent: Verbal consent obtained. Risks and benefits: risks, benefits and alternatives were discussed Consent given by: patient Patient identity confirmed: provided demographic data Prepped and Draped in normal sterile fashion Wound explored  Laceration Location: Dorsal R foot  Laceration Length: 1.5cm  No Foreign Bodies seen or palpated  Anesthesia: local infiltration  Local anesthetic: lidocaine 2% with epinephrine  Anesthetic total: 1.5 ml  Irrigation method: syringe Amount of cleaning: standard  Skin closure: 5-0 prolene  Number of sutures: 3  Technique: 2 simple interrupted, 1 horizontal mattress  Patient tolerance: Patient tolerated the procedure well with no immediate complications.  LACERATION REPAIR Performed by: Antonietta Breach Authorized by: Antonietta Breach Consent:  Verbal consent obtained. Risks and benefits: risks, benefits and alternatives were discussed Consent given by: patient Patient identity confirmed: provided demographic data Prepped and Draped in normal sterile fashion Wound explored  Laceration Location: Dorsal R foot  Laceration Length: 1cm  No Foreign Bodies seen or palpated  Anesthesia: local infiltration  Local anesthetic: lidocaine 2% with epinephrine  Anesthetic total: 1 ml  Irrigation method: syringe Amount of cleaning: standard  Skin closure: 5-0 prolene  Number of sutures: 2  Technique: simple interrupted  Patient tolerance: Patient tolerated the procedure well with no immediate complications.  MDM   Final diagnoses:  Laceration of right foot, initial encounter    Tdap booster given. Pressure irrigation performed. Laceration occurred < 8 hours prior to repair which was well tolerated. Pt has no comorbidities to effect normal wound healing. Discussed suture home care with pt and answered questions. Patient to follow up for wound check and suture removal in 7 days. Pt is hemodynamically stable with no complaints prior to discharge.    Filed Vitals:   10/17/13 2102 10/17/13 2244  BP: 126/72 130/75  Pulse: 95 72  Temp: 97.9 F (36.6 C) 97.7 F (36.5 C)  TempSrc: Oral Oral  Resp: 20 18  Height: 5\' 10"  (1.778 m)   Weight: 230 lb (104.327 kg)   SpO2: 99% 100%      Antonietta Breach, PA-C 10/17/13 2318

## 2013-10-17 NOTE — ED Notes (Signed)
Pt. reported a hedge trimmer fell on her right foot this evening , presents with pain / mild swelling and 2 lacerations - bleeding controlled at right foot.

## 2013-10-17 NOTE — Discharge Instructions (Signed)

## 2013-10-17 NOTE — ED Provider Notes (Signed)
Medical screening examination/treatment/procedure(s) were performed by non-physician practitioner and as supervising physician I was immediately available for consultation/collaboration.   EKG Interpretation None        Orpah Greek, MD 10/17/13 [phone]

## 2014-11-22 IMAGING — CR DG FOOT COMPLETE 3+V*R*
3 series · 3 of 3 positions shown · non-contrast
Comparison: None.

CLINICAL DATA: FOOT INJURY

EXAM:
RIGHT FOOT COMPLETE - 3+ VIEW

[x foot ap right]
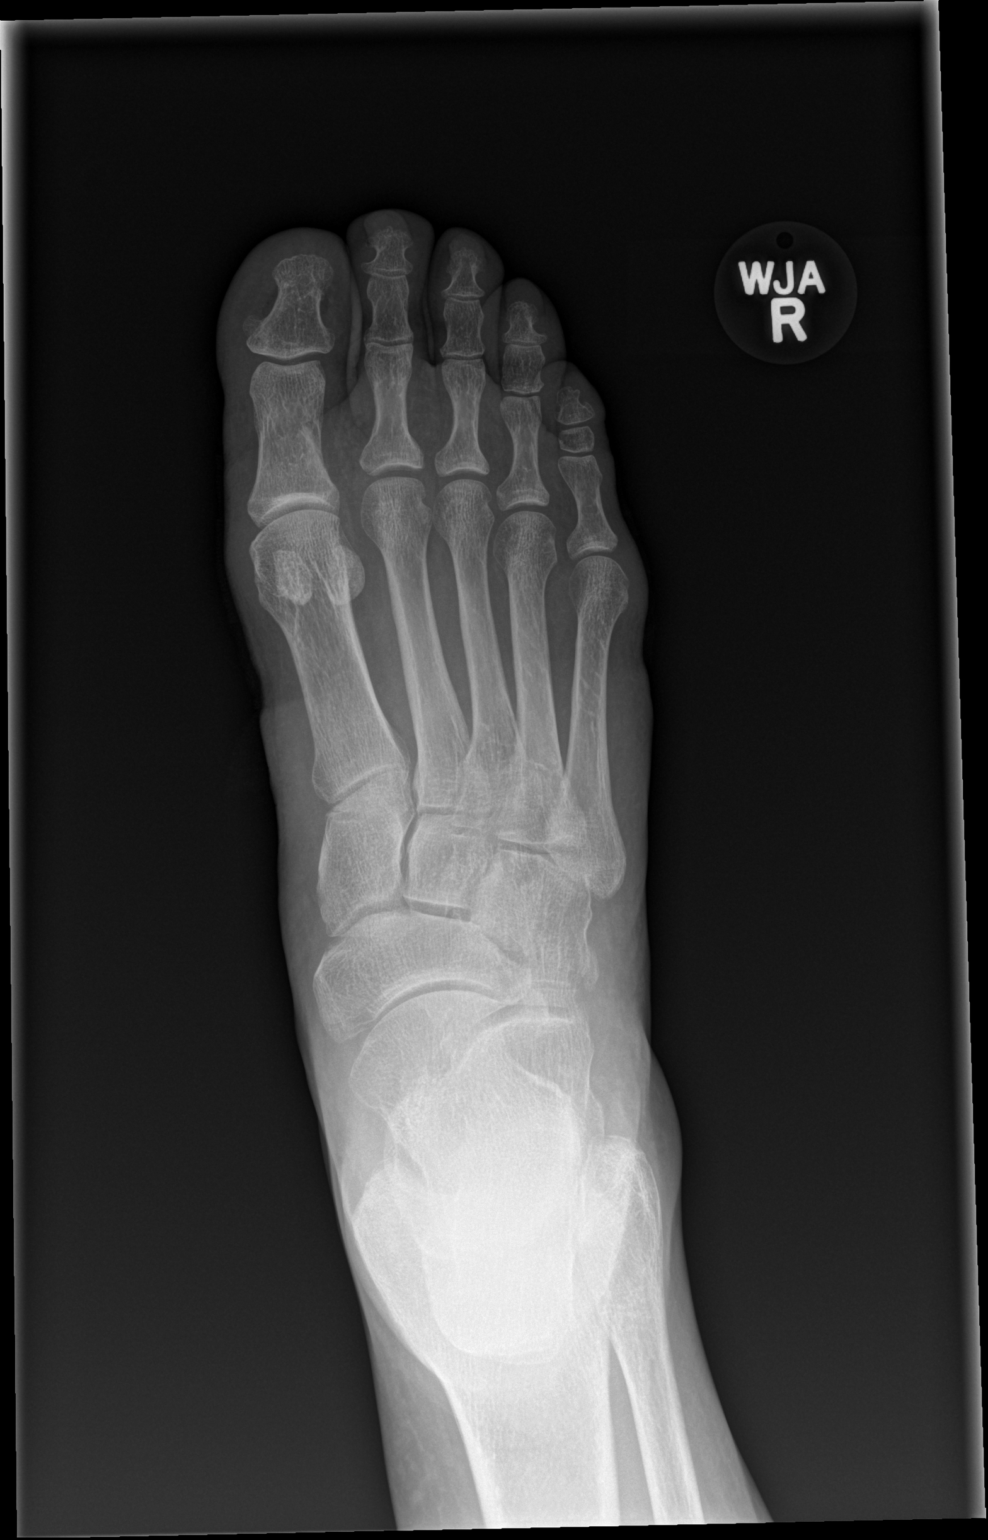

[x foot obl right]
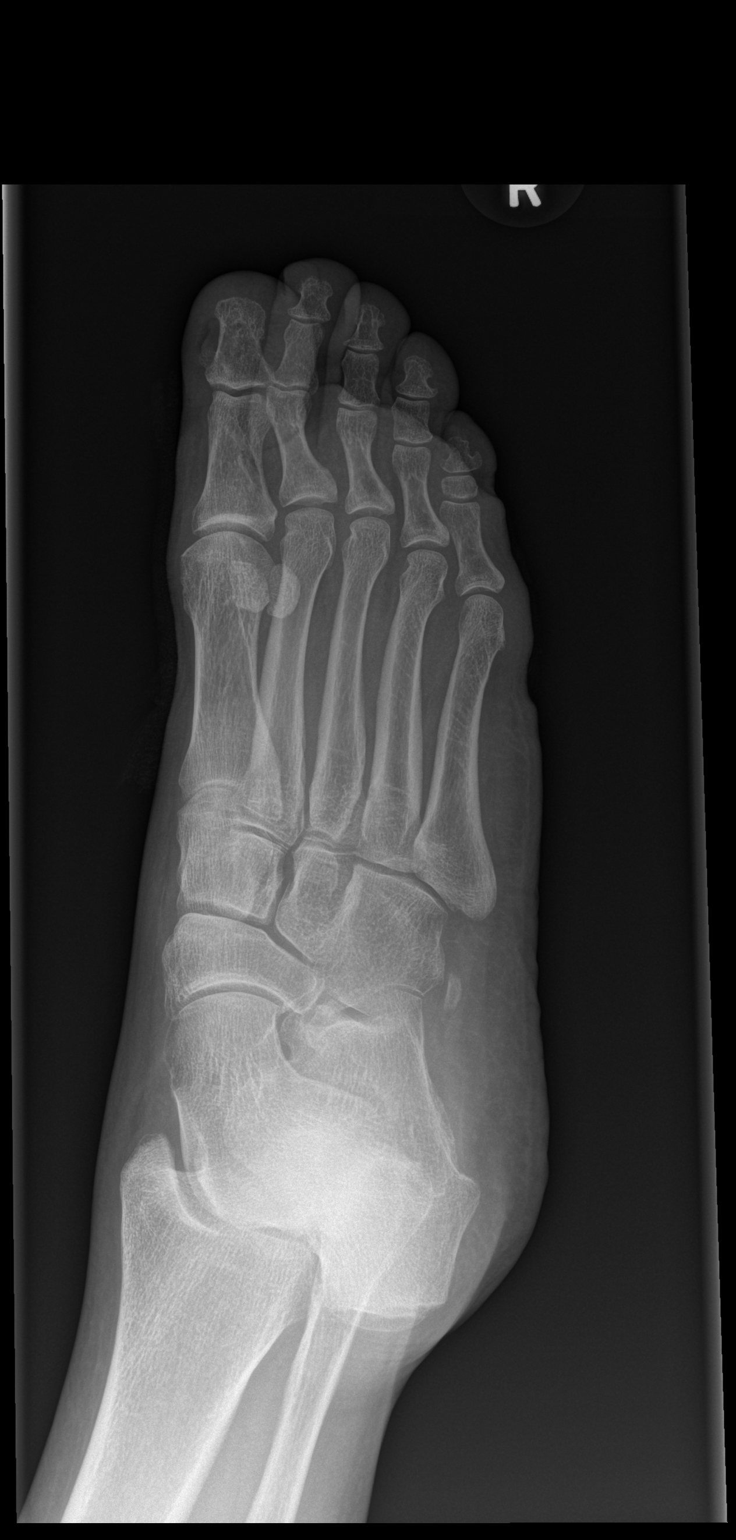

[x foot lat right]
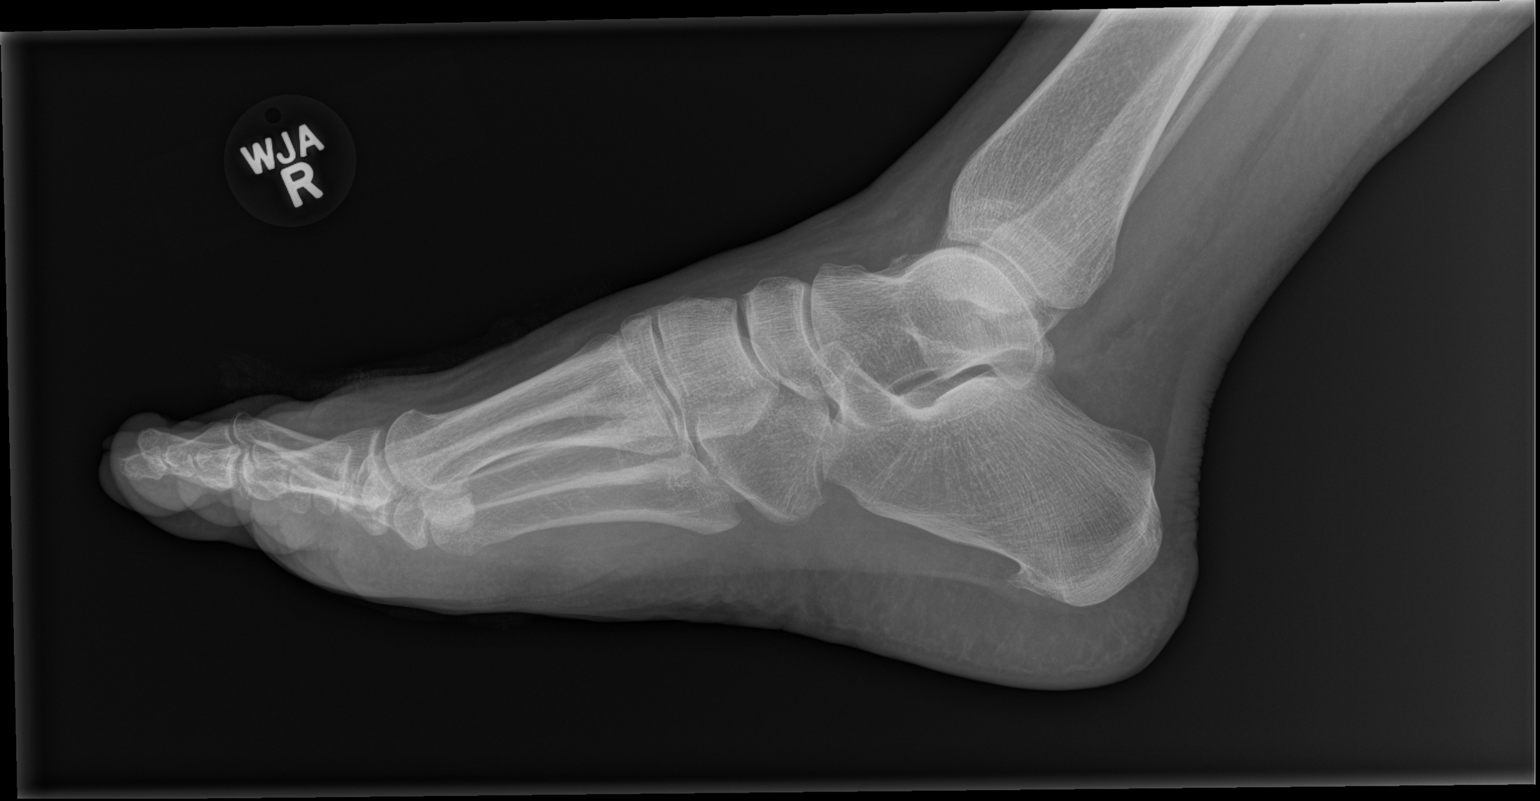

[3 of 3 positions shown; findings below may reference images not displayed]

FINDINGS: There is no evidence of fracture or dislocation. Small calcaneal
spur at the plantar aponeurosis. No radiodense foreign body. No
subcutaneous gas. There is no evidence of arthropathy or other focal
bone abnormality. Soft tissues are unremarkable.
IMPRESSION: Negative.

## 2015-05-29 ENCOUNTER — Other Ambulatory Visit: Payer: Self-pay | Admitting: Obstetrics & Gynecology

## 2015-05-30 LAB — CYTOLOGY - PAP

## 2015-06-20 ENCOUNTER — Other Ambulatory Visit: Payer: Self-pay | Admitting: Family Medicine

## 2016-03-22 DIAGNOSIS — E119 Type 2 diabetes mellitus without complications: Secondary | ICD-10-CM | POA: Diagnosis not present

## 2016-03-22 DIAGNOSIS — L409 Psoriasis, unspecified: Secondary | ICD-10-CM | POA: Diagnosis not present

## 2016-03-22 DIAGNOSIS — K219 Gastro-esophageal reflux disease without esophagitis: Secondary | ICD-10-CM | POA: Diagnosis not present

## 2016-06-10 DIAGNOSIS — Z1231 Encounter for screening mammogram for malignant neoplasm of breast: Secondary | ICD-10-CM | POA: Diagnosis not present

## 2016-07-04 DIAGNOSIS — Z79899 Other long term (current) drug therapy: Secondary | ICD-10-CM | POA: Diagnosis not present

## 2016-07-04 DIAGNOSIS — L308 Other specified dermatitis: Secondary | ICD-10-CM | POA: Diagnosis not present

## 2016-07-04 DIAGNOSIS — L400 Psoriasis vulgaris: Secondary | ICD-10-CM | POA: Diagnosis not present

## 2016-10-02 DIAGNOSIS — E1165 Type 2 diabetes mellitus with hyperglycemia: Secondary | ICD-10-CM | POA: Diagnosis not present

## 2016-10-02 DIAGNOSIS — K219 Gastro-esophageal reflux disease without esophagitis: Secondary | ICD-10-CM | POA: Diagnosis not present

## 2016-10-02 DIAGNOSIS — E119 Type 2 diabetes mellitus without complications: Secondary | ICD-10-CM | POA: Diagnosis not present

## 2016-10-02 DIAGNOSIS — L409 Psoriasis, unspecified: Secondary | ICD-10-CM | POA: Diagnosis not present

## 2016-11-09 DIAGNOSIS — Z23 Encounter for immunization: Secondary | ICD-10-CM | POA: Diagnosis not present

## 2017-01-07 DIAGNOSIS — L237 Allergic contact dermatitis due to plants, except food: Secondary | ICD-10-CM | POA: Diagnosis not present

## 2017-01-07 DIAGNOSIS — L400 Psoriasis vulgaris: Secondary | ICD-10-CM | POA: Diagnosis not present

## 2017-01-07 DIAGNOSIS — L308 Other specified dermatitis: Secondary | ICD-10-CM | POA: Diagnosis not present

## 2017-01-07 DIAGNOSIS — Z79899 Other long term (current) drug therapy: Secondary | ICD-10-CM | POA: Diagnosis not present

## 2017-02-25 DIAGNOSIS — L719 Rosacea, unspecified: Secondary | ICD-10-CM | POA: Diagnosis not present

## 2017-03-14 DIAGNOSIS — L718 Other rosacea: Secondary | ICD-10-CM | POA: Diagnosis not present

## 2017-03-26 DIAGNOSIS — J069 Acute upper respiratory infection, unspecified: Secondary | ICD-10-CM | POA: Diagnosis not present

## 2017-04-09 DIAGNOSIS — E119 Type 2 diabetes mellitus without complications: Secondary | ICD-10-CM | POA: Diagnosis not present

## 2017-04-09 DIAGNOSIS — K219 Gastro-esophageal reflux disease without esophagitis: Secondary | ICD-10-CM | POA: Diagnosis not present

## 2017-04-09 DIAGNOSIS — L409 Psoriasis, unspecified: Secondary | ICD-10-CM | POA: Diagnosis not present

## 2017-06-23 DIAGNOSIS — Z01419 Encounter for gynecological examination (general) (routine) without abnormal findings: Secondary | ICD-10-CM | POA: Diagnosis not present

## 2017-06-23 DIAGNOSIS — Z124 Encounter for screening for malignant neoplasm of cervix: Secondary | ICD-10-CM | POA: Diagnosis not present

## 2017-06-23 DIAGNOSIS — Z1231 Encounter for screening mammogram for malignant neoplasm of breast: Secondary | ICD-10-CM | POA: Diagnosis not present

## 2017-07-08 DIAGNOSIS — L400 Psoriasis vulgaris: Secondary | ICD-10-CM | POA: Diagnosis not present

## 2017-07-08 DIAGNOSIS — Z79899 Other long term (current) drug therapy: Secondary | ICD-10-CM | POA: Diagnosis not present

## 2017-09-19 DIAGNOSIS — E119 Type 2 diabetes mellitus without complications: Secondary | ICD-10-CM | POA: Diagnosis not present

## 2017-09-19 DIAGNOSIS — L409 Psoriasis, unspecified: Secondary | ICD-10-CM | POA: Diagnosis not present

## 2017-09-19 DIAGNOSIS — K219 Gastro-esophageal reflux disease without esophagitis: Secondary | ICD-10-CM | POA: Diagnosis not present

## 2018-01-06 DIAGNOSIS — C44319 Basal cell carcinoma of skin of other parts of face: Secondary | ICD-10-CM | POA: Diagnosis not present

## 2018-01-06 DIAGNOSIS — Z79899 Other long term (current) drug therapy: Secondary | ICD-10-CM | POA: Diagnosis not present

## 2018-01-06 DIAGNOSIS — L400 Psoriasis vulgaris: Secondary | ICD-10-CM | POA: Diagnosis not present

## 2018-02-09 DIAGNOSIS — C44319 Basal cell carcinoma of skin of other parts of face: Secondary | ICD-10-CM | POA: Diagnosis not present

## 2018-03-23 DIAGNOSIS — L409 Psoriasis, unspecified: Secondary | ICD-10-CM | POA: Diagnosis not present

## 2018-03-23 DIAGNOSIS — E119 Type 2 diabetes mellitus without complications: Secondary | ICD-10-CM | POA: Diagnosis not present

## 2018-03-23 DIAGNOSIS — K219 Gastro-esophageal reflux disease without esophagitis: Secondary | ICD-10-CM | POA: Diagnosis not present

## 2018-04-07 DIAGNOSIS — H903 Sensorineural hearing loss, bilateral: Secondary | ICD-10-CM | POA: Diagnosis not present

## 2018-04-07 DIAGNOSIS — H9113 Presbycusis, bilateral: Secondary | ICD-10-CM | POA: Diagnosis not present

## 2018-04-07 DIAGNOSIS — H9313 Tinnitus, bilateral: Secondary | ICD-10-CM | POA: Diagnosis not present

## 2018-07-09 DIAGNOSIS — L400 Psoriasis vulgaris: Secondary | ICD-10-CM | POA: Diagnosis not present

## 2018-07-09 DIAGNOSIS — Z79899 Other long term (current) drug therapy: Secondary | ICD-10-CM | POA: Diagnosis not present

## 2018-07-09 DIAGNOSIS — Z85828 Personal history of other malignant neoplasm of skin: Secondary | ICD-10-CM | POA: Diagnosis not present

## 2022-09-15 ENCOUNTER — Ambulatory Visit (HOSPITAL_COMMUNITY)
Admission: EM | Admit: 2022-09-15 | Discharge: 2022-09-15 | Disposition: A | Discharge status: Home / Self Care | Payer: 59

## 2022-09-15 ENCOUNTER — Encounter (HOSPITAL_COMMUNITY): Payer: Self-pay

## 2022-09-15 DIAGNOSIS — M7989 Other specified soft tissue disorders: Secondary | ICD-10-CM | POA: Diagnosis not present

## 2022-09-15 DIAGNOSIS — I878 Other specified disorders of veins: Secondary | ICD-10-CM | POA: Diagnosis not present

## 2022-09-15 NOTE — Discharge Instructions (Addendum)
Please wear compression stockings (from any store) during the day Elevate the legs as often as possible  Go to the vein & vascular center tomorrow morning at 11 am to have ultrasound performed. They are located at Entrance C of the hospital. Go to the front desk and tell them you are there for a vascular study. This is to rule out a blood clot in the leg!

## 2022-09-15 NOTE — ED Triage Notes (Signed)
Pt presents with bilateral swelling of legs and feet x 2 days.

## 2022-09-15 NOTE — ED Provider Notes (Signed)
[facility]    CSN: [id number] Arrival date & time: 09/15/22  1433      History   Chief Complaint Chief Complaint  Patient presents with   Foot Swelling    HPI [name] is a 67 y.o. female.  Here with leg swelling and rash that started today She just flew back from [place], 5 hour flight Has been driving around a lot recently on vacation Pressure in the lower legs but not much pain.  Rash is red but not itchy.   No history of this Never had a blood clot No interventions attempted  Denies fever, chest pain, shortness of breath   Past Medical History:  Diagnosis Date   Diabetes mellitus without complication (HCC)    Psoriasis     There are no problems to display for this patient.   Past Surgical History:  Procedure Laterality Date   APPENDECTOMY      OB History   No obstetric history on file.      Home Medications    Prior to Admission medications   Medication Sig Start Date End Date Taking? Authorizing Provider  JARDIANCE 25 MG TABS tablet TAKE 1 TABLET BY MOUTH EVERY DAY FOR DIABETES 07/06/21  Yes [provider]  metFORMIN (GLUCOPHAGE-XR) 500 MG 24 hr tablet TAKE 2 TABLETS BY MOUTH EVERY DAY FOR DIABETES 12/28/14  Yes [provider]  rosuvastatin (CRESTOR) 5 MG tablet TAKE 1 TABLET BY MOUTH ONCE DAILY FOR STROKE AND HEART ATTACK PREVENTION 03/19/18  Yes [provider]  HYDROcodone-acetaminophen (NORCO/VICODIN) 5-325 MG per tablet 1 or 2 tabs every 6 hours as needed for pain 10/17/13   Antony Madura, PA-C  methotrexate (RHEUMATREX) 2.5 MG tablet Take 2.5 mg by mouth once a week. Caution:Chemotherapy. Protect from light.    [provider]  omeprazole (PRILOSEC) 20 MG capsule Take 20 mg by mouth daily.    [provider]    Family History History reviewed. No pertinent family history.  Social History Social History   Tobacco Use   Smoking status: Former  Substance Use Topics   Alcohol use:  No   Drug use: No     Allergies   Patient has no known allergies.   Review of Systems Review of Systems As per HPI  Physical Exam Triage Vital Signs ED Triage Vitals [09/15/22 1621]  Encounter Vitals Group     BP 127/74     Systolic BP Percentile      Diastolic BP Percentile      Pulse Rate 82     Resp 16     Temp 98.1 F (36.7 C)     Temp Source Oral     SpO2 98 %     Weight      Height      Head Circumference      Peak Flow      Pain Score      Pain Loc      Pain Education      Exclude from Growth Chart    No data found.  Updated Vital Signs BP 127/74 (BP Location: Left Arm)   Pulse 82   Temp 98.1 F (36.7 C) (Oral)   Resp 16   SpO2 98%    Physical Exam Vitals and nursing note reviewed.  Constitutional:      General: She is not in acute distress. HENT:     Mouth/Throat:     Mouth: Mucous membranes are moist.  Pharynx: Oropharynx is clear.  Cardiovascular:     Rate and Rhythm: Normal rate and regular rhythm.     Pulses: Normal pulses.     Heart sounds: Normal heart sounds.     Comments: RRR Pulmonary:     Effort: Pulmonary effort is normal.     Breath sounds: Normal breath sounds.     Comments: Clear lungs, unlabored  Musculoskeletal:     Cervical back: Normal range of motion.     Right lower leg: Swelling present.     Left lower leg: Swelling present.     Comments: Swelling bilateral lower extremities, extends to mid calf. Non pitting. Worse on left leg. No tenderness to palpation of popliteal and posterior calf. No cords. Full ROM of knees and ankles.   Skin:    Capillary Refill: Capillary refill takes less than 2 seconds.     Comments: Patch of non-blanching erythema on the left lower leg medially. No warmth or tenderness.  Neurological:     Mental Status: She is alert and oriented to person, place, and time.     UC Treatments / Results  Labs (all labs ordered are listed, but only abnormal results are displayed) Labs Reviewed - No  data to display  EKG   Radiology No results found.  Procedures Procedures (including critical care time)  Medications Ordered in UC Medications - No data to display  Initial Impression / Assessment and Plan / UC Course  I have reviewed the triage vital signs and the nursing notes.  Pertinent labs & imaging results that were available during my care of the patient were reviewed by me and considered in my medical decision making (see chart for details).  Venous stasis Likely from her recent traveling, elevation changes, prolonged sitting . Discussed compression stockings and elevating legs as much as possible. Low concern for DVT but will have her go to vein & vascular clinic at 11 am tomorrow for U/S to rule out.  Discussed return and ED precautions. Patient agreeable to plan, all questions answered  Final Clinical Impressions(s) / UC Diagnoses   Final diagnoses:  Leg swelling  Venous stasis     Discharge Instructions      Please wear compression stockings (from any store) during the day Elevate the legs as often as possible  Go to the vein & vascular center tomorrow morning at 11 am to have ultrasound performed. They are located at Entrance C of the hospital. Go to the front desk and tell them you are there for a vascular study. This is to rule out a blood clot in the leg!    ED Prescriptions   None    PDMP not reviewed this encounter.   Kathrine Haddock 09/15/22 4010

## 2022-09-16 ENCOUNTER — Ambulatory Visit (HOSPITAL_COMMUNITY)
Admission: RE | Admit: 2022-09-16 | Discharge: 2022-09-16 | Disposition: A | Discharge status: Home / Self Care | Payer: 59 | Source: Ambulatory Visit | Attending: Emergency Medicine | Admitting: Emergency Medicine

## 2022-09-16 DIAGNOSIS — M7989 Other specified soft tissue disorders: Secondary | ICD-10-CM | POA: Insufficient documentation

## 2022-09-16 DIAGNOSIS — R21 Rash and other nonspecific skin eruption: Secondary | ICD-10-CM | POA: Insufficient documentation

## 2022-09-16 NOTE — Progress Notes (Signed)
VASCULAR LAB    Left lower extremity venous duplex has been performed.  See CV proc for preliminary results.   Zanae Kuehnle, RVT 09/16/2022, 11:31 AM
